# Patient Record
Sex: Male | Born: 1980 | Race: White | Hispanic: No | Marital: Single | Smoking: Never smoker
Health system: Southern US, Community
[De-identification: ages and names within clinical notes are randomized; demographics above are authoritative.]

## PROBLEM LIST (undated history)

## (undated) DIAGNOSIS — G4730 Sleep apnea, unspecified: Secondary | ICD-10-CM

## (undated) HISTORY — PX: BRAIN SURGERY: SHX531

## (undated) HISTORY — DX: Sleep apnea, unspecified: G47.30

---

## 2006-10-06 ENCOUNTER — Emergency Department (HOSPITAL_COMMUNITY): Admission: EM | Admit: 2006-10-06 | Discharge: 2006-10-07 | Payer: Self-pay | Admitting: Emergency Medicine
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
## 2010-01-30 ENCOUNTER — Encounter: Payer: Self-pay | Admitting: *Deleted

## 2010-10-20 LAB — I-STAT 8, (EC8 V) (CONVERTED LAB)
Acid-base deficit: 2
Bicarbonate: 25.1 — ABNORMAL HIGH
Chloride: 105
HCT: 38 — ABNORMAL LOW
Operator id: 272551
Sodium: 140
pCO2, Ven: 49.8

## 2010-10-20 LAB — RAPID URINE DRUG SCREEN, HOSP PERFORMED
Amphetamines: NOT DETECTED
Barbiturates: NOT DETECTED
Cocaine: NOT DETECTED
Opiates: NOT DETECTED
Tetrahydrocannabinol: NOT DETECTED

## 2010-10-20 LAB — DIFFERENTIAL
Basophils Absolute: 0
Basophils Relative: 0
Lymphocytes Relative: 12
Monocytes Relative: 5
Neutro Abs: 10.2 — ABNORMAL HIGH
Neutrophils Relative %: 81 — ABNORMAL HIGH

## 2010-10-20 LAB — CBC
Hemoglobin: 12.4 — ABNORMAL LOW
MCHC: 33.4
MCV: 88.6
RBC: 4.19 — ABNORMAL LOW
WBC: 12.5 — ABNORMAL HIGH

## 2010-10-20 LAB — POCT I-STAT CREATININE: Creatinine, Ser: 1.1

## 2010-10-20 LAB — ETHANOL: Alcohol, Ethyl (B): <5

## 2019-10-17 ENCOUNTER — Other Ambulatory Visit: Payer: Self-pay

## 2019-10-17 ENCOUNTER — Emergency Department (HOSPITAL_COMMUNITY)
Admission: EM | Admit: 2019-10-17 | Discharge: 2019-10-17 | Disposition: A | Discharge status: Home / Self Care | Payer: PRIVATE HEALTH INSURANCE | Attending: Emergency Medicine | Admitting: Emergency Medicine

## 2019-10-17 ENCOUNTER — Emergency Department (HOSPITAL_COMMUNITY): Payer: PRIVATE HEALTH INSURANCE

## 2019-10-17 ENCOUNTER — Encounter (HOSPITAL_COMMUNITY): Payer: Self-pay | Admitting: Emergency Medicine

## 2019-10-17 DIAGNOSIS — S51012A Laceration without foreign body of left elbow, initial encounter: Secondary | ICD-10-CM | POA: Diagnosis not present

## 2019-10-17 DIAGNOSIS — Z23 Encounter for immunization: Secondary | ICD-10-CM | POA: Diagnosis not present

## 2019-10-17 MED ORDER — CEPHALEXIN 250 MG PO CAPS
500.0000 mg | ORAL_CAPSULE | Freq: Once | ORAL | Status: AC
Start: 2019-10-17 — End: 2019-10-17
  Administered 2019-10-17: 500 mg via ORAL
  Filled 2019-10-17: qty 2

## 2019-10-17 MED ORDER — LIDOCAINE-EPINEPHRINE 1 %-1:100000 IJ SOLN
10.0000 mL | Freq: Once | INTRAMUSCULAR | Status: AC
  Administered 2019-10-17: 10 mL
  Filled 2019-10-17: qty 1

## 2019-10-17 MED ORDER — TETANUS-DIPHTH-ACELL PERTUSSIS 5-2.5-18.5 LF-MCG/0.5 IM SUSP
0.5000 mL | Freq: Once | INTRAMUSCULAR | Status: AC
  Administered 2019-10-17: 0.5 mL via INTRAMUSCULAR
  Filled 2019-10-17: qty 0.5

## 2019-10-17 MED ORDER — ACETAMINOPHEN 500 MG PO TABS
1000.0000 mg | ORAL_TABLET | Freq: Once | ORAL | Status: AC
  Administered 2019-10-17: 1000 mg via ORAL
  Filled 2019-10-17: qty 2

## 2019-10-17 NOTE — ED Notes (Signed)
Patient verbalizes understanding of discharge instructions. Opportunity for questioning and answers were provided. Armband removed by staff, pt discharged from ED ambulatory.   
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
## 2019-10-17 NOTE — ED Notes (Signed)
Pt in xray

## 2019-10-17 NOTE — Discharge Instructions (Addendum)
Follow up in 7-10 days for removal. Please return if increasing redness, warmth, or pustulance.

## 2019-10-27 ENCOUNTER — Emergency Department (HOSPITAL_COMMUNITY)
Admission: EM | Admit: 2019-10-27 | Discharge: 2019-10-27 | Disposition: A | Discharge status: Home / Self Care | Payer: PRIVATE HEALTH INSURANCE | Attending: Emergency Medicine | Admitting: Emergency Medicine

## 2019-10-27 ENCOUNTER — Encounter (HOSPITAL_COMMUNITY): Payer: Self-pay

## 2019-10-27 DIAGNOSIS — X58XXXA Exposure to other specified factors, initial encounter: Secondary | ICD-10-CM | POA: Diagnosis not present

## 2019-10-27 DIAGNOSIS — S51012D Laceration without foreign body of left elbow, subsequent encounter: Secondary | ICD-10-CM | POA: Insufficient documentation

## 2019-10-27 DIAGNOSIS — Z4802 Encounter for removal of sutures: Secondary | ICD-10-CM

## 2019-10-27 NOTE — ED Triage Notes (Signed)
Pt here to get stiches removed from left elbow, placed 11 days ago
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
## 2019-10-27 NOTE — ED Notes (Signed)
ED Provider at bedside. 

## 2020-08-05 ENCOUNTER — Ambulatory Visit (INDEPENDENT_AMBULATORY_CARE_PROVIDER_SITE_OTHER): Payer: Self-pay | Admitting: Pulmonary Disease

## 2020-08-05 ENCOUNTER — Other Ambulatory Visit: Payer: Self-pay

## 2020-08-05 ENCOUNTER — Encounter: Payer: Self-pay | Admitting: Pulmonary Disease

## 2020-08-05 VITALS — BP 130/80 | HR 112 | Ht 72.0 in | Wt 369.2 lb

## 2020-08-05 DIAGNOSIS — G4719 Other hypersomnia: Secondary | ICD-10-CM

## 2020-08-05 NOTE — Progress Notes (Signed)
Brian Wu    [id number]    Sep 22, 1980  Primary Care Physician:Pcp, No  Referring Physician: No referring provider defined for this encounter.  Chief complaint:  Patient being seen for possible obstructive sleep apnea  HPI:  History of snoring, witnessed apneas Nonrestorative sleep Drowsiness  Usually goes to bed about 1130 Wake up time about 630 5 to 15 minutes of sleep onset Weight is recently down -Working on weight loss  Dad has obstructive sleep apnea on CPAP  Admits to night sweats Occasional headaches Admits to dryness of his mouth in the morning  He does have hypertension that is controlled Non-smoker No pertinent occupational history  Outpatient Encounter Medications as of 08/05/2020  Medication Sig   Multiple Vitamin (MULTIVITAMIN) tablet Take 1 tablet by mouth daily.   No facility-administered encounter medications on file as of 08/05/2020.    Allergies as of 08/05/2020   (No Known Allergies)    No past medical history on file.  No past surgical history on file.  Family History  Problem Relation Age of Onset   Diabetes Mother    Diabetes Maternal Grandmother    Lymphoma Maternal Grandmother    Diabetes Maternal Grandfather    Diabetes Paternal Grandmother    Diabetes Paternal Grandfather     Social History   Socioeconomic History   Marital status: Single    Spouse name: Not on file   Number of children: Not on file   Years of education: Not on file   Highest education level: Not on file  Occupational History   Not on file  Tobacco Use   Smoking status: Never   Smokeless tobacco: Never  Substance and Sexual Activity   Alcohol use: Not Currently   Drug use: Not Currently   Sexual activity: Not on file  Other Topics Concern   Not on file  Social History Narrative   Not on file   Social Determinants of Health   Financial Resource Strain: Not on file  Food Insecurity: Not on file  Transportation Needs: Not on  file  Physical Activity: Not on file  Stress: Not on file  Social Connections: Not on file  Intimate Partner Violence: Not on file    Review of Systems  Constitutional:  Positive for fatigue.  Respiratory:  Positive for apnea.   Psychiatric/Behavioral:  Positive for sleep disturbance.    Vitals:   08/05/20 0929  BP: 130/80  Pulse: (!) 112  SpO2: 97%     Physical Exam Constitutional:      Appearance: He is obese.  HENT:     Head: Normocephalic.     Nose: No congestion.     Mouth/Throat:     Mouth: Mucous membranes are moist.     Comments: Mallampati 4, crowded oropharynx Eyes:     Pupils: Pupils are equal, round, and reactive to light.  Neck:     Comments: Neck is thick, supple Cardiovascular:     Rate and Rhythm: Normal rate and regular rhythm.     Heart sounds: No murmur heard.   No friction rub.  Pulmonary:     Effort: Pulmonary effort is normal. No respiratory distress.     Breath sounds: No stridor. No wheezing or rhonchi.  Musculoskeletal:     Cervical back: No rigidity or tenderness.  Neurological:     Mental Status: He is alert.  Psychiatric:        Mood and Affect: Mood normal.   Results  of the Epworth flowsheet 08/05/2020  Sitting and reading 3  Watching TV 3  Sitting, inactive in a public place (e.g. a theatre or a meeting) 3  As a passenger in a car for an hour without a break 1  Lying down to rest in the afternoon when circumstances permit 3  Sitting and talking to someone 0  Sitting quietly after a lunch without alcohol 0  In a car, while stopped for a few minutes in traffic 0  Total score 13    Assessment:  Moderate probability of significant obstructive sleep apnea  Morbid obesity  Hypertension  Pathophysiology of sleep disordered breathing discussed with the patient Treatment options discussed with the patient  Plan/Recommendations: Schedule patient for home sleep study  Weight loss efforts encouraged  Encourage sleep in the  lateral position, sleeping in a recliner may help-which he does once in a while  I will tentatively follow-up with him in about 3 to 4 months  Encouraged to call with any significant concerns   Virl Diamond MD [facility] 08/05/2020, 9:57 AM  CC: No ref. provider found

## 2020-08-05 NOTE — Patient Instructions (Signed)
Moderate probability of significant obstructive sleep apnea  We will schedule you for home sleep study Update your results as soon as reviewed  We will see you in 3 to 4 months  Sleep Apnea Sleep apnea is a condition in which breathing pauses or becomes shallow during sleep. People with sleep apnea usually snore loudly. They may have times when they gasp and stop breathing for 10 seconds or more during sleep. This mayhappen many times during the night. Sleep apnea disrupts your sleep and keeps your body from getting the rest that it needs. This condition can increase your risk of certain health problems, including: Heart attack. Stroke. Obesity. Type 2 diabetes. Heart failure. Irregular heartbeat. High blood pressure. The goal of treatment is to help you breathe normally again. What are the causes? The most common cause of sleep apnea is a collapsed or blocked airway. There are three kinds of sleep apnea: Obstructive sleep apnea. This kind is caused by a blocked or collapsed airway. Central sleep apnea. This kind happens when the part of the brain that controls breathing does not send the correct signals to the muscles that control breathing. Mixed sleep apnea. This is a combination of obstructive and central sleep apnea. What increases the risk? You are more likely to develop this condition if you: Are overweight. Smoke. Have a smaller than normal airway. Are older. Are male. Drink alcohol. Take sedatives or tranquilizers. Have a family history of sleep apnea. Have a tongue or tonsils that are larger than normal. What are the signs or symptoms? Symptoms of this condition include: Trouble staying asleep. Loud snoring. Morning headaches. Waking up gasping. Dry mouth or sore throat in the morning. Daytime sleepiness and tiredness. If you have daytime fatigue because of sleep apnea, you may be more likely to have: Trouble concentrating. Forgetfulness. Irritability or mood  swings. Personality changes. Feelings of depression. Sexual dysfunction. This may include loss of interest if you are male, or erectile dysfunction if you are male. How is this diagnosed? This condition may be diagnosed with: A medical history. A physical exam. A series of tests that are done while you are sleeping (sleep study). These tests are usually done in a sleep lab, but they may also be done at home. How is this treated? Treatment for this condition aims to restore normal breathing and to ease symptoms during sleep. It may involve managing health issues that can affect breathing, such as high blood pressure or obesity. Treatment may include: Sleeping on your side. Using a decongestant if you have nasal congestion. Avoiding the use of depressants, including alcohol, sedatives, and narcotics. Losing weight if you are overweight. Making changes to your diet. Quitting smoking. Using a device to open your airway while you sleep, such as: An oral appliance. This is a custom-made mouthpiece that shifts your lower jaw forward. A continuous positive airway pressure (CPAP) device. This device blows air through a mask when you breathe out (exhale). A nasal expiratory positive airway pressure (EPAP) device. This device has valves that you put into each nostril. A bi-level positive airway pressure (BPAP) device. This device blows air through a mask when you breathe in (inhale) and breathe out (exhale). Having surgery if other treatments do not work. During surgery, excess tissue is removed to create a wider airway. Follow these instructions at home: Lifestyle Make any lifestyle changes that your health care provider recommends. Eat a healthy, well-balanced diet. Take steps to lose weight if you are overweight. Avoid using depressants, including  alcohol, sedatives, and narcotics. Do not use any products that contain nicotine or tobacco. These products include cigarettes, chewing tobacco, and  vaping devices, such as e-cigarettes. If you need help quitting, ask your health care provider. General instructions Take over-the-counter and prescription medicines only as told by your health care provider. If you were given a device to open your airway while you sleep, use it only as told by your health care provider. If you are having surgery, make sure to tell your health care provider you have sleep apnea. You may need to bring your device with you. Keep all follow-up visits. This is important. Contact a health care provider if: The device that you received to open your airway during sleep is uncomfortable or does not seem to be working. Your symptoms do not improve. Your symptoms get worse. Get help right away if: You develop: Chest pain. Shortness of breath. Discomfort in your back, arms, or stomach. You have: Trouble speaking. Weakness on one side of your body. Drooping in your face. These symptoms may represent a serious problem that is an emergency. Do not wait to see if the symptoms will go away. Get medical help right away. Call your local emergency services (911 in the [place] Do not drive yourself to the hospital. Summary Sleep apnea is a condition in which breathing pauses or becomes shallow during sleep. The most common cause is a collapsed or blocked airway. The goal of treatment is to restore normal breathing and to ease symptoms during sleep. This information is not intended to replace advice given to you by your health care provider. Make sure you discuss any questions you have with your healthcare provider. Document Revised: 12/05/2019 Document Reviewed: 12/05/2019 Elsevier Patient Education  2022 [employer].

## 2020-08-05 NOTE — Addendum Note (Signed)
Addended by: Arvilla Market on: 08/05/2020 10:09 AM   Modules accepted: Orders

## 2020-09-10 ENCOUNTER — Other Ambulatory Visit: Payer: Self-pay

## 2020-09-10 ENCOUNTER — Ambulatory Visit: Payer: Self-pay

## 2020-09-10 DIAGNOSIS — G4719 Other hypersomnia: Secondary | ICD-10-CM

## 2020-09-10 DIAGNOSIS — G4733 Obstructive sleep apnea (adult) (pediatric): Secondary | ICD-10-CM

## 2020-09-15 ENCOUNTER — Telehealth: Payer: Self-pay | Admitting: Pulmonary Disease

## 2020-09-15 DIAGNOSIS — G4733 Obstructive sleep apnea (adult) (pediatric): Secondary | ICD-10-CM

## 2020-09-15 NOTE — Telephone Encounter (Signed)
Call patient  Sleep study result  Date of study: 09/11/2020  Impression: Severe obstructive sleep apnea Severe oxygen desaturations  Recommendation: Recommend CPAP therapy for severe obstructive sleep apnea Patient to be scheduled for an in lab titration study for severe obstructive sleep apnea and associated severe desaturations -May need oxygen supplementation  Encourage weight loss measures  Follow-up as previously scheduled
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
## 2020-09-24 NOTE — Telephone Encounter (Signed)
ATC, left VM. 

## 2020-09-27 NOTE — Telephone Encounter (Signed)
Spoke with patient regarding sleep study results. They verbalized understanding. No further questions. Order for CPAP titration has been ordered. Nothing further needed at this time.

## 2020-11-19 ENCOUNTER — Ambulatory Visit (HOSPITAL_BASED_OUTPATIENT_CLINIC_OR_DEPARTMENT_OTHER): Payer: Self-pay | Attending: Pulmonary Disease | Admitting: Pulmonary Disease

## 2020-11-19 ENCOUNTER — Other Ambulatory Visit: Payer: Self-pay

## 2020-11-19 DIAGNOSIS — G4733 Obstructive sleep apnea (adult) (pediatric): Secondary | ICD-10-CM

## 2020-11-29 ENCOUNTER — Telehealth: Payer: Self-pay | Admitting: Pulmonary Disease

## 2020-11-29 DIAGNOSIS — G4733 Obstructive sleep apnea (adult) (pediatric): Secondary | ICD-10-CM

## 2020-11-29 DIAGNOSIS — G4719 Other hypersomnia: Secondary | ICD-10-CM

## 2020-11-29 NOTE — Telephone Encounter (Signed)
Call patient  Sleep study result  Date of study: 11/19/2020  Impression: Severe obstructive sleep apnea Severe oxygen desaturations  Recommendation: DME referral  Recommend Bipap  therapy for moderate obstructive sleep apnea  Trial of BiPAP therapy on 21/17 cm H2O , bi-flex of 2, with a Large size Fisher&Paykel Nasal Mask Eson mask and heated humidification.  Encourage weight loss measures  Follow-up in the office 4 to 6 weeks following initiation of treatment

## 2020-11-29 NOTE — Procedures (Signed)
POLYSOMNOGRAPHY  Last, First: Brian Wu, Brian Wu MRN: [id number] Gender: Male Age (years): 40 Weight (lbs): 380 DOB: 02/24/1980 BMI: 53 Primary Care: No PCP Epworth Score: 7 Referring: Tomma Lightning MD Technician: Cherylann Parr Interpreting: Tomma Lightning MD Study Type: BiPAP Ordered Study Type: CPAP Study date: 11/19/2020 Location: [place] CLINICAL INFORMATION Brian Wu is a 40 year old Male and was referred to the sleep center for evaluation of N/A. Indications include N/A.  MEDICATIONS Patient self administered medications include: N/A. Medications administered during study include No sleep medicine administered.  SLEEP STUDY TECHNIQUE The patient underwent an attended overnight polysomnography titration to assess the effects of BIPAP therapy. The following variables were monitored: EEG(C4-A1, C3-A2, O1-A2, O2-A1), EOG, submental and leg EMG, ECG, oxyhemoglobin saturation by pulse oximetry, thoracic and abdominal respiratory effort belts, nasal/oral airflow by pressure sensor, body position sensor and snoring sensor. BIPAP pressure was titrated to eliminate apneas, hypopneas and oxygen desaturation. Hypopneas were scored per AASM definition IB (4% desaturation)  TECHNICIAN COMMENTS Comments added by Technician: No oxygen therapy was started. Comments added by Scorer: N/A SLEEP ARCHITECTURE The study was initiated at 11:16:31 PM and terminated at 5:15:47 AM. Total recorded time was 359.3 minutes. EEG confirmed total sleep time was 332.3 minutes yielding a sleep efficiency of 92.5%%. Sleep onset after lights out was 4.0 minutes with a REM latency of 183.5 minutes. The patient spent 1.7%% of the night in stage N1 sleep, 78.8%% in stage N2 sleep, 0.0%% in stage N3 and 19.6% in REM. The Arousal Index was 25.1/hour. RESPIRATORY PARAMETERS The overall AHI was 51.3 per hour, and the RDI was 51.8 events/hour with a central apnea index of 1.6per hour. The most appropriate setting  of BiPAP was 21/17 cm H2O. At this setting, the sleep efficiency was 100 % and the patient was supine for 43%. The AHI was 0 events per hour, and the RDI was 0 events/hour (with 1.6 central events) and the arousal index was 4.5 per hour.The oxygen nadir was 88.0% during sleep.    The cumulative time under 88% oxygen saturation was 5.5 minutes  LEG MOVEMENT DATA The total leg movements were 0 with a resulting leg movement index of 0.0. Associated arousal with leg movement index was 0.0. CARDIAC DATA The underlying cardiac rhythm was most consistent with sinus rhythm. Mean heart rate during sleep was 78.7 bpm. Additional rhythm abnormalities include Ventricular Tachycardia, PVCs.  IMPRESSIONS - Severe Obstructive Sleep apnea(OSA) Optimal pressure attained. - Electrocardiographic data showed presence of Ventricular Tachycardia, PVCs. - No snoring was audible during this study. - No significant periodic leg movements(PLMs) during sleep. However, no significant associated arousals.  DIAGNOSIS - Obstructive Sleep Apnea (G47.33)  RECOMMENDATIONS - Trial of BiPAP therapy on 21/17  cm H2O , bi-flex of 2, with a Large size Fisher&Paykel Nasal Mask Eson mask and heated humidification. - Avoid alcohol, sedatives and other CNS depressants that may worsen sleep apnea and disrupt normal sleep architecture. - Sleep hygiene should be reviewed to assess factors that may improve sleep quality. - Weight management and regular exercise should be initiated or continued. - Return to Sleep Center for re-evaluation after 4 weeks of therapy  [Electronically signed] 11/29/2020 02:55 PM  Virl Diamond MD NPI: [id number]

## 2020-11-29 NOTE — Telephone Encounter (Signed)
I called the patient and gave him the results and he is agreeable to start the CPAP.   I let patient know I am placing a Bipap order. The patient is aware that it may take some time to get the machine and he will call back to set up a 4-6 week follow up after getting the machine.

## 2020-12-03 ENCOUNTER — Ambulatory Visit: Payer: Self-pay | Admitting: Pulmonary Disease

## 2020-12-20 ENCOUNTER — Telehealth: Payer: Self-pay | Admitting: Pulmonary Disease

## 2020-12-20 NOTE — Telephone Encounter (Signed)
I was trying to call and see if the patient was set up for a BiPAP as of now. He has a follow up and if he is coming we need an SD card for his information. I was not able to leave a message.

## 2020-12-21 ENCOUNTER — Encounter: Payer: Self-pay | Admitting: Pulmonary Disease

## 2020-12-21 ENCOUNTER — Ambulatory Visit (INDEPENDENT_AMBULATORY_CARE_PROVIDER_SITE_OTHER): Payer: Self-pay | Admitting: Pulmonary Disease

## 2020-12-21 ENCOUNTER — Other Ambulatory Visit: Payer: Self-pay

## 2020-12-21 VITALS — BP 126/82 | HR 80 | Temp 98.0°F | Ht 73.0 in | Wt 380.0 lb

## 2020-12-21 DIAGNOSIS — G4733 Obstructive sleep apnea (adult) (pediatric): Secondary | ICD-10-CM

## 2020-12-21 NOTE — Patient Instructions (Signed)
Follow-up in about 3 months  Let us know soon after you get your machine if you are not tolerating the pressures well  Encouraged graded exercises to help with weight loss    Call us with significant concerns

## 2020-12-21 NOTE — Progress Notes (Signed)
Brian Wu    [id number]    July 06, 1980  Primary Care Physician:Pcp, No  Referring Physician: No referring provider defined for this encounter.  Chief complaint:  Patient being seen for follow-up for obstructive sleep apnea  HPI:  he has been titrated to a pressure of 21/17 Still yet to receive his machine  Slept well during the titration study and felt better the next day  No significant changes in his health since the last visit  History of snoring, witnessed apneas Nonrestorative sleep Drowsiness  Usually goes to bed about 1130 Wake up time about 630 5 to 15 minutes of sleep onset Weight is recently down -Working on weight loss  Dad has obstructive sleep apnea on CPAP  Admits to night sweats Occasional headaches Admits to dryness of his mouth in the morning  He does have hypertension that is controlled Non-smoker No pertinent occupational history  Outpatient Encounter Medications as of 12/21/2020  Medication Sig   Multiple Vitamin (MULTIVITAMIN) tablet Take 1 tablet by mouth daily.   No facility-administered encounter medications on file as of 12/21/2020.    Allergies as of 12/21/2020   (No Known Allergies)    No past medical history on file.  No past surgical history on file.  Family History  Problem Relation Age of Onset   Diabetes Mother    Diabetes Maternal Grandmother    Lymphoma Maternal Grandmother    Diabetes Maternal Grandfather    Diabetes Paternal Grandmother    Diabetes Paternal Grandfather     Social History   Socioeconomic History   Marital status: Single    Spouse name: Not on file   Number of children: Not on file   Years of education: Not on file   Highest education level: Not on file  Occupational History   Not on file  Tobacco Use   Smoking status: Never   Smokeless tobacco: Never  Substance and Sexual Activity   Alcohol use: Not Currently   Drug use: Not Currently   Sexual activity: Not on file   Other Topics Concern   Not on file  Social History Narrative   Not on file   Social Determinants of Health   Financial Resource Strain: Not on file  Food Insecurity: Not on file  Transportation Needs: Not on file  Physical Activity: Not on file  Stress: Not on file  Social Connections: Not on file  Intimate Partner Violence: Not on file    Review of Systems  Constitutional:  Positive for fatigue.  Respiratory:  Positive for apnea.   Psychiatric/Behavioral:  Positive for sleep disturbance.    Vitals:   12/21/20 1507  BP: 126/82  Pulse: 80  Temp: 98 F (36.7 C)  SpO2: 98%     Physical Exam Constitutional:      Appearance: He is obese.  HENT:     Head: Normocephalic.     Nose: No congestion.     Mouth/Throat:     Mouth: Mucous membranes are moist.     Comments: Mallampati 4, crowded oropharynx Eyes:     Pupils: Pupils are equal, round, and reactive to light.  Neck:     Comments: Neck is thick, supple Cardiovascular:     Rate and Rhythm: Normal rate and regular rhythm.     Heart sounds: No murmur heard.   No friction rub.  Pulmonary:     Effort: Pulmonary effort is normal. No respiratory distress.     Breath sounds:  No stridor. No wheezing or rhonchi.  Musculoskeletal:     Cervical back: No rigidity or tenderness.  Neurological:     Mental Status: He is alert.  Psychiatric:        Mood and Affect: Mood normal.   Results of the Epworth flowsheet 08/05/2020  Sitting and reading 3  Watching TV 3  Sitting, inactive in a public place (e.g. a theatre or a meeting) 3  As a passenger in a car for an hour without a break 1  Lying down to rest in the afternoon when circumstances permit 3  Sitting and talking to someone 0  Sitting quietly after a lunch without alcohol 0  In a car, while stopped for a few minutes in traffic 0  Total score 13    Assessment:   Patient with severe obstructive sleep apnea Diagnosed from a home sleep study Titrated to BiPAP  21/17  -Yet to receive his machine  Feels relatively well otherwise  Morbid obesity  Hypertension   Plan/Recommendations:  DME referral for set up for his BiPAP -He is scheduled to take classes for have to take care of her machine -He will have his machine soon after that  We will see him back in about 3 months  Encouraged to call with any significant concerns if he is not tolerating pressures well   Virl Diamond MD [facility] 12/21/2020, 3:13 PM  CC: No ref. provider found

## 2021-03-21 ENCOUNTER — Other Ambulatory Visit: Payer: Self-pay

## 2021-03-21 ENCOUNTER — Ambulatory Visit (INDEPENDENT_AMBULATORY_CARE_PROVIDER_SITE_OTHER): Payer: Self-pay | Admitting: Pulmonary Disease

## 2021-03-21 ENCOUNTER — Encounter: Payer: Self-pay | Admitting: Pulmonary Disease

## 2021-03-21 VITALS — BP 126/80 | HR 87 | Temp 98.7°F | Ht 72.0 in | Wt 356.0 lb

## 2021-03-21 DIAGNOSIS — G4733 Obstructive sleep apnea (adult) (pediatric): Secondary | ICD-10-CM

## 2021-03-21 DIAGNOSIS — Z9989 Dependence on other enabling machines and devices: Secondary | ICD-10-CM

## 2021-03-21 NOTE — Patient Instructions (Signed)
Continue using a BiPAP on a nightly basis ? ?We will contact the DME company to decrease the pressure to 20/16 ? ?Call us with significant concerns ? ?Follow-up in 3 months ? ?Continue weight loss efforts ?

## 2021-03-21 NOTE — Progress Notes (Signed)
? ?      ?Brian Wu    [id number]    17-Mar-1980 ? ?Primary Care Physician:Pcp, No ? ?Referring Physician: No referring provider defined for this encounter. ? ?Chief complaint:  ?Patient being seen for follow-up for obstructive sleep apnea  ? ?HPI: ? ?he has been titrated to a pressure of 21/17 ?Has been using CPAP on a nightly basis ? ?Sometimes wakes up with the mask off his face ? ?Overall feeling a lot better ?Better energy levels ? ?Some difficulty falling asleep which may be related to caffeinated beverages ? ?Tolerating pressures well but occasionally feels the pressure may be high ? ?Better since her last visit ? ?Diagnosed with severe obstructive sleep apnea ? ?Usually goes to bed about 1130 ?Wake up time about 630 ?Sleep onset is longer with using BiPAP ?Weight is recently down ?-Working on weight loss ? ?Dad has obstructive sleep apnea on CPAP ? ?Admits to night sweats ?Occasional headaches ?Admits to dryness of his mouth in the morning ? ?He does have hypertension that is controlled ?Non-smoker ?No pertinent occupational history ? ?Outpatient Encounter Medications as of 03/21/2021  ?Medication Sig  ? Multiple Vitamin (MULTIVITAMIN) tablet Take 1 tablet by mouth daily.  ? ?No facility-administered encounter medications on file as of 03/21/2021.  ? ? ?Allergies as of 03/21/2021  ? (No Known Allergies)  ? ? ?No past medical history on file. ? ?No past surgical history on file. ? ?Family History  ?Problem Relation Age of Onset  ? Diabetes Mother   ? Diabetes Maternal Grandmother   ? Lymphoma Maternal Grandmother   ? Diabetes Maternal Grandfather   ? Diabetes Paternal Grandmother   ? Diabetes Paternal Grandfather   ? ? ?Social History  ? ?Socioeconomic History  ? Marital status: Single  ?  Spouse name: Not on file  ? Number of children: Not on file  ? Years of education: Not on file  ? Highest education level: Not on file  ?Occupational History  ? Not on file  ?Tobacco Use  ? Smoking status: Never  ?  Smokeless tobacco: Never  ?Substance and Sexual Activity  ? Alcohol use: Not Currently  ? Drug use: Not Currently  ? Sexual activity: Not on file  ?Other Topics Concern  ? Not on file  ?Social History Narrative  ? Not on file  ? ?Social Determinants of Health  ? ?Financial Resource Strain: Not on file  ?Food Insecurity: Not on file  ?Transportation Needs: Not on file  ?Physical Activity: Not on file  ?Stress: Not on file  ?Social Connections: Not on file  ?Intimate Partner Violence: Not on file  ? ? ?Review of Systems  ?Constitutional:  Positive for fatigue.  ?Respiratory:  Positive for apnea.   ?Psychiatric/Behavioral:  Positive for sleep disturbance.   ? ?Vitals:  ? 03/21/21 1444  ?BP: 126/80  ?Pulse: 87  ?Temp: 98.7 ?F (37.1 ?C)  ?SpO2: 95%  ? ? ? ?Physical Exam ?Constitutional:   ?   Appearance: He is obese.  ?HENT:  ?   Head: Normocephalic.  ?   Nose: No congestion.  ?   Mouth/Throat:  ?   Mouth: Mucous membranes are moist.  ?   Comments: Mallampati 4, crowded oropharynx ?Eyes:  ?   Pupils: Pupils are equal, round, and reactive to light.  ?Neck:  ?   Comments: Neck is thick, supple ?Cardiovascular:  ?   Rate and Rhythm: Normal rate and regular rhythm.  ?   Heart sounds: No murmur  heard. ?  No friction rub.  ?Pulmonary:  ?   Effort: Pulmonary effort is normal. No respiratory distress.  ?   Breath sounds: No stridor. No wheezing or rhonchi.  ?Musculoskeletal:  ?   Cervical back: No rigidity or tenderness.  ?Neurological:  ?   Mental Status: He is alert.  ?Psychiatric:     ?   Mood and Affect: Mood normal.  ? ?Results of the Epworth flowsheet 08/05/2020  ?Sitting and reading 3  ?Watching TV 3  ?Sitting, inactive in a public place (e.g. a theatre or a meeting) 3  ?As a passenger in a car for an hour without a break 1  ?Lying down to rest in the afternoon when circumstances permit 3  ?Sitting and talking to someone 0  ?Sitting quietly after a lunch without alcohol 0  ?In a car, while stopped for a few minutes in  traffic 0  ?Total score 13  ? ?Compliance data shows 74% compliance ?Average use of about 4 hours ?Residual AHI of 3.7 ? ?Assessment:  ? ?Patient with severe obstructive sleep apnea ?Diagnosed from a home sleep study ?Titrated to BiPAP 21/17 ?Has been using BiPAP nightly ?-Pressure may be high ?-Sometimes wakes up with the mask off his face ? ?Feels  better with BiPAP use ? ?Morbid obesity ?-More active ?-Working on weight loss ? ?Hypertension ? ? ?Plan/Recommendations: ? ?DME referral for set up for his BiPAP ?-Adjust pressure down from 21 /17-20/16 ?-Encouraged to continue BiPAP on a nightly basis ? ?Discussed weight loss efforts ?-Decreased calorie intake ?-Exercise regularly ? ?I will see him back in about 3 months ? ?With concerns about taking the mask off and the sensation of high pressures ?-It is appropriate to decrease his pressure ? ?Encouraged to call with any significant concerns if he is not tolerating pressures well ? ? ?Sherrilyn Rist MD ?[facility] ?03/21/2021, 2:51 PM ? ?CC: No ref. provider found ? ? ? ?

## 2021-03-21 NOTE — Addendum Note (Signed)
Addended by: Arvilla Market on: 03/21/2021 03:30 PM ? ? Modules accepted: Orders ? ?

## 2021-06-21 ENCOUNTER — Encounter: Payer: Self-pay | Admitting: Pulmonary Disease

## 2021-06-21 ENCOUNTER — Ambulatory Visit (INDEPENDENT_AMBULATORY_CARE_PROVIDER_SITE_OTHER): Payer: Self-pay | Admitting: Pulmonary Disease

## 2021-06-21 VITALS — BP 124/82 | HR 99 | Temp 98.1°F | Ht 72.0 in | Wt 365.0 lb

## 2021-06-21 DIAGNOSIS — Z9989 Dependence on other enabling machines and devices: Secondary | ICD-10-CM

## 2021-06-21 DIAGNOSIS — G4733 Obstructive sleep apnea (adult) (pediatric): Secondary | ICD-10-CM

## 2021-06-21 NOTE — Progress Notes (Signed)
Brian Wu    [id number]    1980/12/05  Primary Care Physician:Pcp, No  Referring Physician: No referring provider defined for this encounter.  Chief complaint:  Patient being seen for follow-up for obstructive sleep apnea In for follow-up today, has been doing well  HPI:  he has been titrated to a pressure of 21/17  Continues to use BiPAP on a nightly basis Feels he continues to benefit from it No significant daytime sleepiness  Usually goes to bed about midnight, wake up time varies quite a bit but does get enough sleep Feels well rested No significant daytime sleepiness  Weight has been stable No recent health issues  Sometimes wakes up with the mask off his face  Good energy levels  Diagnosed with severe obstructive sleep apnea  Dad has obstructive sleep apnea on CPAP  Admits to night sweats Occasional headaches Admits to dryness of his mouth in the morning  He does have hypertension that is controlled Non-smoker No pertinent occupational history  Outpatient Encounter Medications as of 06/21/2021  Medication Sig   Multiple Vitamin (MULTIVITAMIN) tablet Take 1 tablet by mouth daily.   No facility-administered encounter medications on file as of 06/21/2021.    Allergies as of 06/21/2021   (No Known Allergies)    No past medical history on file.  No past surgical history on file.  Family History  Problem Relation Age of Onset   Diabetes Mother    Diabetes Maternal Grandmother    Lymphoma Maternal Grandmother    Diabetes Maternal Grandfather    Diabetes Paternal Grandmother    Diabetes Paternal Grandfather     Social History   Socioeconomic History   Marital status: Single    Spouse name: Not on file   Number of children: Not on file   Years of education: Not on file   Highest education level: Not on file  Occupational History   Not on file  Tobacco Use   Smoking status: Never   Smokeless tobacco: Never  Substance and  Sexual Activity   Alcohol use: Not Currently   Drug use: Not Currently   Sexual activity: Not on file  Other Topics Concern   Not on file  Social History Narrative   Not on file   Social Determinants of Health   Financial Resource Strain: Not on file  Food Insecurity: Not on file  Transportation Needs: Not on file  Physical Activity: Not on file  Stress: Not on file  Social Connections: Not on file  Intimate Partner Violence: Not on file    Review of Systems  Constitutional:  Positive for fatigue.  Respiratory:  Positive for apnea.   Psychiatric/Behavioral:  Positive for sleep disturbance.     Vitals:   06/21/21 1337  BP: 124/82  Pulse: 99  Temp: 98.1 F (36.7 C)  SpO2: 98%     Physical Exam Constitutional:      Appearance: He is obese.  HENT:     Head: Normocephalic.     Nose: No congestion.     Mouth/Throat:     Mouth: Mucous membranes are moist.     Comments: Mallampati 4, crowded oropharynx Eyes:     Pupils: Pupils are equal, round, and reactive to light.  Neck:     Comments: Neck is thick, supple Cardiovascular:     Rate and Rhythm: Normal rate and regular rhythm.     Heart sounds: No murmur heard.    No friction rub.  Pulmonary:     Effort: Pulmonary effort is normal. No respiratory distress.     Breath sounds: No stridor. No wheezing or rhonchi.  Musculoskeletal:     Cervical back: No rigidity or tenderness.  Neurological:     Mental Status: He is alert.  Psychiatric:        Mood and Affect: Mood normal.       08/05/2020    9:00 AM  Results of the Epworth flowsheet  Sitting and reading 3  Watching TV 3  Sitting, inactive in a public place (e.g. a theatre or a meeting) 3  As a passenger in a car for an hour without a break 1  Lying down to rest in the afternoon when circumstances permit 3  Sitting and talking to someone 0  Sitting quietly after a lunch without alcohol 0  In a car, while stopped for a few minutes in traffic 0  Total  score 13   Compliance download result reviewed showing 100% compliance with CPAP use Machine set 21/17 Average AHI of 2.4  Assessment:   Severe obstructive sleep apnea adequately treated with BiPAP  Continues to benefit from BiPAP use  Class III obesity -Encouraged to continue weight loss efforts  Hypertension  Plan/Recommendations:  Continue BiPAP nightly  Follow-up in 6 months  Call with significant concerns     Sherrilyn Rist MD [facility] 06/21/2021, 1:48 PM  CC: No ref. provider found

## 2021-06-21 NOTE — Patient Instructions (Signed)
Continue using BiPAP on a regular basis  I will see you back in 6 months  Call with significant concerns  Continue weight loss efforts
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
## 2023-06-12 ENCOUNTER — Encounter: Payer: Self-pay | Admitting: Allergy

## 2023-06-12 ENCOUNTER — Ambulatory Visit: Payer: Self-pay | Admitting: Allergy

## 2023-06-12 VITALS — BP 136/86 | HR 106 | Temp 98.4°F | Resp 20 | Ht 69.75 in | Wt 331.0 lb

## 2023-06-12 DIAGNOSIS — H1013 Acute atopic conjunctivitis, bilateral: Secondary | ICD-10-CM | POA: Diagnosis not present

## 2023-06-12 DIAGNOSIS — J310 Chronic rhinitis: Secondary | ICD-10-CM

## 2023-06-12 DIAGNOSIS — H109 Unspecified conjunctivitis: Secondary | ICD-10-CM

## 2023-06-12 NOTE — Patient Instructions (Addendum)
 Rhinoconjunctivitis, presumed allergic Chronic seasonal allergic rhinitis exacerbated in spring and fall. Previous antihistamines ineffective or caused adverse effects. Discussed newer antihistamines with fewer side effects. Emphasized safe neti pot use. Explained preference for skin testing over blood testing. Discussed allergy immunotherapy as long-term solution. - Trial fexofenadine 180mg  or levocetirizine 5mg  daily as needed.  These are newer long-acting antihistamines to try to replace Benadryl use - Use saline rinse kit to help flush/clean nasal cavity.  Use distilled water or boil water and bring to room temperature (do not use tap water).  Keep mouth open during entirety of rinse.  Kit provided.  - Recommend Pataday eye drops 1 drop each eye daily as needed for itchy/watery eyes.  - Discuss skin testing for allergen identification.  Schedule skin testing visit and avoid antihistamines for 3 days prior.   - Discuss allergy immunotherapy if skin testing positive. - Provide avoidance measure handouts post-allergy testing.  Schedule skin testing visit.  Routine follow-up in 4-6 months.

## 2023-06-12 NOTE — Progress Notes (Signed)
 New Patient Note  RE: Brian Wu MRN: [id number] DOB: June 18, 1980 Date of Office Visit: 06/12/2023  Primary care provider: Patient, No Pcp Per  Chief Complaint: allergies  History of present illness: Brian Wu is a 43 y.o. male presenting today for evaluation of environmental allergies.  Discussed the use of AI scribe software for clinical note transcription with the patient, who gave verbal consent to proceed.  He experiences severe allergies during seasonal changes, particularly in spring and fall, since childhood. Symptoms include cough, rhinorrhea, sinus pressure in the forehead, nasal congestion, and phlegm. Last year, symptoms escalated to a sinus infection, resulting in temporary voice loss, although he did not seek medical treatment at that time.  He experiences itchy eyes as part of his allergy symptoms but has not used allergy-specific eye drops.  He uses Benadryl at the onset of symptoms but finds it only necessary at the beginning of the allergy season. He has tried other antihistamines such as Zyrtec and Claritin but discontinued them due to side effects like excessive nasal discharge. These medications have not been effective in alleviating his symptoms. He has not used nasal sprays regularly and dislikes them. He purchased a neti pot but has not used it due to concerns about potential infections.   No history of asthma, eczema, or food allergies.      Review of systems: 10pt ROS negative unless noted above in HPI  Past medical history: Past Medical History:  Diagnosis Date   Sleep apnea     Past surgical history: Past Surgical History:  Procedure Laterality Date   BRAIN SURGERY     laceration at age 36    Family history:  Family History  Problem Relation Age of Onset   Diabetes Mother    Diabetes Maternal Grandmother    Lymphoma Maternal Grandmother    Diabetes Maternal Grandfather    Diabetes Paternal Grandmother    Diabetes Paternal Grandfather     Allergic rhinitis Neg Hx    Asthma Neg Hx     Social history: Lives in a mobile home with carpeting in the bedroom with electric heating and central cooling.  No pets in the home.  There are cats and dogs outside the home.  There is no concern for water damage, mildew in the home.  There is concern for roaches in the home.  He works in [employer] and does [employer].  Denies a smoking history.   Medication List: Current Outpatient Medications  Medication Sig Dispense Refill   diphenhydrAMINE (BENADRYL) 25 mg capsule Take 25 mg by mouth every 6 (six) hours as needed.     No current facility-administered medications for this visit.    Known medication allergies: No Known Allergies   Physical examination: Blood pressure 136/86, pulse (!) 106, temperature 98.4 F (36.9 C), temperature source Temporal, resp. rate 20, height 5' 9.75" (1.772 m), weight (!) 331 lb (150.1 kg), SpO2 98%.  General: Alert, interactive, in no acute distress. HEENT: PERRLA, TMs pearly gray, turbinates minimally edematous without discharge, post-pharynx non erythematous. Neck: Supple without lymphadenopathy. Lungs: Clear to auscultation without wheezing, rhonchi or rales. {no increased work of breathing. CV: Normal S1, S2 without murmurs. Abdomen: Nondistended, nontender. Skin: Warm and dry, without lesions or rashes. Extremities:  No clubbing, cyanosis or edema. Neuro:   Grossly intact.  Diagnostics/Labs: None today  Assessment and plan: Rhinoconjunctivitis, presumed allergic Chronic seasonal allergic rhinitis exacerbated in spring and fall. Previous antihistamines ineffective or caused adverse effects. Discussed newer antihistamines  with fewer side effects. Emphasized safe neti pot use. Explained preference for skin testing over blood testing. Discussed allergy immunotherapy as long-term solution. - Trial fexofenadine 180mg  or levocetirizine 5mg  daily as needed.  These are newer long-acting  antihistamines to try to replace Benadryl use - Use saline rinse kit to help flush/clean nasal cavity.  Use distilled water or boil water and bring to room temperature (do not use tap water).  Keep mouth open during entirety of rinse.  Kit provided.  - Recommend Pataday eye drops 1 drop each eye daily as needed for itchy/watery eyes.  - Discuss skin testing for allergen identification.  Schedule skin testing visit and avoid antihistamines for 3 days prior.   - Discuss allergy immunotherapy if skin testing positive. - Provide avoidance measure handouts post-allergy testing.  Schedule skin testing visit. (Environment 1-55) Routine follow-up in 4-6 months.  I appreciate the opportunity to take part in Brian Wu's care. Please do not hesitate to contact me with questions.  Sincerely,   Catha Clink, MD Allergy/Immunology [facility]

## 2023-07-03 ENCOUNTER — Encounter: Payer: Self-pay | Admitting: Allergy

## 2023-07-03 ENCOUNTER — Ambulatory Visit: Admitting: Allergy

## 2023-07-03 DIAGNOSIS — H1013 Acute atopic conjunctivitis, bilateral: Secondary | ICD-10-CM | POA: Diagnosis not present

## 2023-07-03 DIAGNOSIS — J302 Other seasonal allergic rhinitis: Secondary | ICD-10-CM

## 2023-07-03 DIAGNOSIS — J3089 Other allergic rhinitis: Secondary | ICD-10-CM | POA: Diagnosis not present

## 2023-07-03 NOTE — Patient Instructions (Signed)
 Rhinoconjunctivitis, allergic Chronic seasonal allergic rhinitis exacerbated in spring and fall. Previous antihistamines ineffective or caused adverse effects. Discussed newer antihistamines with fewer side effects. Emphasized safe neti pot use. Discussed allergy immunotherapy as long-term solution. - Trial fexofenadine 180mg  or levocetirizine 5mg  daily as needed.  These are newer long-acting antihistamines to try to replace Benadryl use - Use saline rinse kit to help flush/clean nasal cavity.  Use distilled water or boil water and bring to room temperature (do not use tap water).  Keep mouth open during entirety of rinse.  Kit provided.  - Recommend Pataday eye drops 1 drop each eye daily as needed for itchy/watery eyes.   - Testing today showed: weeds, trees, indoor molds, outdoor molds, and dust mites - Copy of test results provided.  - Avoidance measures provided. - Consider allergy shots as a means of long-term control if medication management is not effective enough. Allergy shots re-train and reset the immune system to ignore environmental allergens and decrease the resulting immune response to those allergens (sneezing, itchy watery eyes, runny nose, nasal congestion, etc).  Allergy shots improve symptoms in 75-85% of patients. We can discuss more at a future appointment if the medications are not working for you.    Routine follow-up in 4-6 months.

## 2023-07-03 NOTE — Progress Notes (Signed)
 Follow-up Note  RE: Brian Wu MRN: [id number] DOB: March 07, 1980 Date of Office Visit: 07/03/2023   History of present illness: Brian Wu is a 43 y.o. male presenting today for skin testing visit.  He was last seen in the office on 06/12/2023 for rhinoconjunctivitis.  He is in his usual state of health today without recent illness.  He has held antihistamines for at least 3 days for testing today.   Medication List: Current Outpatient Medications  Medication Sig Dispense Refill   diphenhydrAMINE (BENADRYL) 25 mg capsule Take 25 mg by mouth every 6 (six) hours as needed.     No current facility-administered medications for this visit.     Known medication allergies: No Known Allergies  Diagnostics/Labs:  Allergy testing:   Airborne Adult Perc - 07/03/23 0926     Time Antigen Placed 9073    Allergen Manufacturer Jestine    Location Back    Number of Test 55    1. Control-Buffer 50% Glycerol Negative    2. Control-Histamine 2+    3. Bahia Negative    4. French Southern Territories Negative    5. Johnson Negative    6. [place]  Blue Negative    7. Meadow Fescue Negative    8. Perennial Rye Negative    9. Timothy Negative    10. Ragweed Mix Negative    11. Cocklebur 2+    12. Plantain,  English Negative    13. Baccharis 2+    14. Dog Fennel Negative    15. Guernsey Thistle 2+    16. Lamb's Quarters Negative    17. Sheep Sorrell Negative    18. Rough Pigweed Negative    19. Marsh Elder, Rough 2+    20. Mugwort, Common 2+    21. Box, Elder 2+    22. Cedar, red Negative    23. Sweet Gum Negative    24. Pecan Pollen 2+    25. Pine Mix Negative    26. Walnut, Black Pollen Negative    27. Red Mulberry Negative    28. Ash Mix Negative    29. Birch Mix 2+    30. Beech American 2+    31. Cottonwood, Guinea-[place]issau Negative    32. Hickory, White Negative    33. Maple Mix Negative    34. Oak, [place] Mix Negative    35. Sycamore Eastern Negative    36. Alternaria Alternata Negative    37.  Cladosporium Herbarum 2+    38. Aspergillus Mix 2+    39. Penicillium Mix Negative    40. Bipolaris Sorokiniana (Helminthosporium) 2+    41. Drechslera Spicifera (Curvularia) Negative    42. Mucor Plumbeus Negative    43. Fusarium Moniliforme 2+    44. Aureobasidium Pullulans (pullulara) Negative    45. Rhizopus Oryzae Negative    46. Botrytis Cinera 2+    47. Epicoccum Nigrum Negative    48. Phoma Betae Negative    49. Dust Mite Mix Negative    50. Cat Hair 10,000 BAU/ml Negative    51.  Dog Epithelia Negative    52. Mixed Feathers Negative    53. Horse Epithelia Negative    54. Cockroach, German Negative    55. Tobacco Leaf Negative          Intradermal - 07/03/23 0959     Time Antigen Placed 9040    Allergen Manufacturer Jestine    Location Arm    Number of Test 9    Control Negative  Bahia Negative    French Southern Territories Negative    7 Grass Negative    Ragweed Mix Negative    Mite Mix 2+    Cat Negative    Dog Negative    Cockroach Negative          Allergy testing results were read and interpreted by provider, documented by clinical staff.   Assessment and plan: Rhinoconjunctivitis, allergic Chronic seasonal allergic rhinitis exacerbated in spring and fall. Previous antihistamines ineffective or caused adverse effects. Discussed newer antihistamines with fewer side effects. Emphasized safe neti pot use. Discussed allergy immunotherapy as long-term solution. - Trial fexofenadine 180mg  or levocetirizine 5mg  daily as needed.  These are newer long-acting antihistamines to try to replace Benadryl use - Use saline rinse kit to help flush/clean nasal cavity.  Use distilled water or boil water and bring to room temperature (do not use tap water).  Keep mouth open during entirety of rinse.  Kit provided.  - Recommend Pataday eye drops 1 drop each eye daily as needed for itchy/watery eyes.   - Testing today showed: weeds, trees, indoor molds, outdoor molds, and dust mites - Copy  of test results provided.  - Avoidance measures provided. - Consider allergy shots as a means of long-term control if medication management is not effective enough. Allergy shots re-train and reset the immune system to ignore environmental allergens and decrease the resulting immune response to those allergens (sneezing, itchy watery eyes, runny nose, nasal congestion, etc).  Allergy shots improve symptoms in 75-85% of patients. We can discuss more at a future appointment if the medications are not working for you.    Routine follow-up in 4-6 months.  I appreciate the opportunity to take part in Brian Wu's care. Please do not hesitate to contact me with questions.  Sincerely,   Danita Brain, MD Allergy/Immunology [facility]

## 2024-01-15 ENCOUNTER — Encounter: Payer: Self-pay | Admitting: Family Medicine

## 2024-01-15 ENCOUNTER — Ambulatory Visit (INDEPENDENT_AMBULATORY_CARE_PROVIDER_SITE_OTHER): Payer: Self-pay | Admitting: Family Medicine

## 2024-01-15 ENCOUNTER — Telehealth: Payer: Self-pay | Admitting: *Deleted

## 2024-01-15 ENCOUNTER — Ambulatory Visit: Admitting: Allergy

## 2024-01-15 VITALS — BP 150/102 | HR 96 | Resp 16

## 2024-01-15 DIAGNOSIS — R030 Elevated blood-pressure reading, without diagnosis of hypertension: Secondary | ICD-10-CM | POA: Insufficient documentation

## 2024-01-15 DIAGNOSIS — J302 Other seasonal allergic rhinitis: Secondary | ICD-10-CM | POA: Insufficient documentation

## 2024-01-15 DIAGNOSIS — J3089 Other allergic rhinitis: Secondary | ICD-10-CM

## 2024-01-15 DIAGNOSIS — H1013 Acute atopic conjunctivitis, bilateral: Secondary | ICD-10-CM | POA: Insufficient documentation

## 2024-01-15 NOTE — Telephone Encounter (Signed)
 Brian Wu- pt is wanting to start allergy  injections. He is self pay. He would need 2 vials. So it would be 66% discount of $2400 (Vial set), which would be $816 and 66% discount for the 2 injections of $40 would be $26.40. How much would his next red maintenance 2 vials be?

## 2024-01-15 NOTE — Progress Notes (Signed)
 "  [address] [place] [address] Dept: [phone]  FOLLOW UP NOTE  Patient ID: Brian Wu, male    DOB: [birth year]  Age: 44 y.o. MRN: [id number] Date of Office Visit: 01/15/2024  Assessment  Chief Complaint: Allergies  HPI Brian Wu is a 44 year old male who presents to the clinic for follow-up visit.  He was originally seen in this clinic on 06/12/2023 by Dr. Jeneal as a new patient for evaluation of allergic rhinitis and allergic conjunctivitis.  He returned on 07/03/2023 for environmental allergy  skin testing.  Discussed the use of AI scribe software for clinical note transcription with the patient, who gave verbal consent to proceed.  History of Present Illness Brian Wu is a 44 year old male with lifelong allergies who presents for follow-up and consideration of allergy  immunotherapy.  He has experienced severe allergy  symptoms throughout his life, with the worst episode occurring over the past year. During this episode, he experienced symptoms akin to severe flu, including complete loss of voice for two to three days. His symptoms typically worsen during seasonal changes, particularly at the beginning of summer and winter.  His allergy  symptoms include runny nose, sneezing, sore throat, congestion, and red or itchy eyes. He is not currently taking any allergy  medications but has previously tried Zyrtec with variable results. He has not used nasal sprays and does not use any treatment for his eye symptoms. His last environmental allergy  skin testing on 07/03/2023 was positive to weed pollen, tree pollen, indoor mold, outdoor mold, and dust mite.  He is interested in beginning allergen immunotherapy at this time.  We discussed benefits and risk of allergen immunotherapy as well as scheduling and length of treatment course.  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 He has a history of high blood pressure, which improved with a low sodium diet, but he has since returned to consuming processed foods.  His current medications are listed in the chart.     Drug Allergies:  Allergies[1]  Physical Exam: BP (!) 150/102   Pulse 96   Resp 16   SpO2 98%    Physical Exam Vitals reviewed.  Constitutional:      Appearance: Normal appearance.  HENT:     Head: Normocephalic and atraumatic.     Right Ear: Tympanic membrane normal.     Left Ear: Tympanic membrane normal.     Nose:     Comments: Bilateral nares normal.  Pharynx normal.  Ears normal.  Eyes normal.    Mouth/Throat:     Pharynx: Oropharynx is clear.  Eyes:     Conjunctiva/sclera: Conjunctivae normal.  Cardiovascular:     Rate and Rhythm: Normal rate and regular rhythm.     Heart sounds: Normal heart sounds. No murmur heard. Pulmonary:     Effort: Pulmonary effort is normal.     Breath sounds: Normal breath sounds.     Comments: Lungs clear to auscultation Musculoskeletal:        General: Normal range of motion.     Cervical back: Normal range of motion and neck supple.  Skin:    General: Skin is warm and dry.  Neurological:     Mental Status: He is alert and oriented to person, place, and time.  Psychiatric:        Mood and Affect: Mood normal.        Behavior: Behavior normal.  Thought Content: Thought content normal.        Judgment: Judgment normal.     Assessment and Plan: 1. Seasonal and perennial allergic rhinitis   2. Allergic conjunctivitis of both eyes   3. Elevated blood pressure reading     No orders of the defined types were placed in this encounter.   Patient Instructions  Allergic rhinitis Continue allergen avoidance measures directed toward weed pollen, tree pollen, indoor mold, outdoor mold, and dust mite as listed below Continue an antihistamine once a day if needed for runny nose or itch.  You may take it additional dose of antihistamine  once a day if needed for breakthrough symptoms. Remember to rotate to a different antihistamine about every 3 months. Some examples of over the counter antihistamines include Zyrtec (cetirizine), Xyzal (levocetirizine), Allegra (fexofenadine), and Claritin (loratidine).  Consider saline nasal rinses as needed for nasal symptoms. Use this before any medicated nasal sprays for best result Consider allergen immunotherapy if your symptoms are not well-controlled with the treatment plan as listed above  Make an appointment for your first allergy  injection for about 3 weeks from now.  After your first injection come to the clinic once or twice a week for injections. Have access to a set of EpiPen 's per injection protocol  Allergic conjunctivitis Some over the counter eye drops include Pataday one drop in each eye once a day as needed for red, itchy eyes OR Zaditor one drop in each eye twice a day as needed for red itchy eyes. Avoid eye drops that say red eye relief as they may contain medications that dry out your eyes.   Your blood pressure was elevated at today's visit. Follow up with your primary care provider for evaluation and treatment of your blood pressure  Call the clinic if this treatment plan is not working well for you.  Follow up in 4 months or sooner if needed.   Return in about 4 months (around 05/14/2024), or if symptoms worsen or fail to improve.    Thank you for the opportunity to care for this patient.  Please do not hesitate to contact me with questions.  Arlean Mutter, FNP [facility]          [1] No Known Allergies  "

## 2024-01-15 NOTE — Patient Instructions (Addendum)
 Allergic rhinitis Continue allergen avoidance measures directed toward weed pollen, tree pollen, indoor mold, outdoor mold, and dust mite as listed below Continue an antihistamine once a day if needed for runny nose or itch.  You may take it additional dose of antihistamine once a day if needed for breakthrough symptoms. Remember to rotate to a different antihistamine about every 3 months. Some examples of over the counter antihistamines include Zyrtec (cetirizine), Xyzal (levocetirizine), Allegra (fexofenadine), and Claritin (loratidine).  Consider saline nasal rinses as needed for nasal symptoms. Use this before any medicated nasal sprays for best result Consider allergen immunotherapy if your symptoms are not well-controlled with the treatment plan as listed above  Make an appointment for your first allergy  injection for about 3 weeks from now.  After your first injection come to the clinic once or twice a week for injections.  Allergic conjunctivitis Some over the counter eye drops include Pataday one drop in each eye once a day as needed for red, itchy eyes OR Zaditor one drop in each eye twice a day as needed for red itchy eyes. Avoid eye drops that say red eye relief as they may contain medications that dry out your eyes.   Your blood pressure was elevated at today's visit. Follow up with your primary care provider for evaluation and treatment of your blood pressure  Call the clinic if this treatment plan is not working well for you.  Follow up in 4 months or sooner if needed.  Reducing Pollen Exposure The American Academy of Allergy , Asthma and Immunology suggests the following steps to reduce your exposure to pollen during allergy  seasons. Do not hang sheets or clothing out to dry; pollen may collect on these items. Do not mow lawns or spend time around freshly cut grass; mowing stirs up pollen. Keep windows closed at night.  Keep car windows closed while driving. Minimize morning  activities outdoors, a time when pollen counts are usually at their highest. Stay indoors as much as possible when pollen counts or humidity is high and on windy days when pollen tends to remain in the air longer. Use air conditioning when possible.  Many air conditioners have filters that trap the pollen spores. Use a HEPA room air filter to remove pollen form the indoor air you breathe.  Control of Mold Allergen Mold and fungi can grow on a variety of surfaces provided certain temperature and moisture conditions exist.  Outdoor molds grow on plants, decaying vegetation and soil.  The major outdoor mold, Alternaria and Cladosporium, are found in very high numbers during hot and dry conditions.  Generally, a late Summer - Fall peak is seen for common outdoor fungal spores.  Rain will temporarily lower outdoor mold spore count, but counts rise rapidly when the rainy period ends.  The most important indoor molds are Aspergillus and Penicillium.  Dark, humid and poorly ventilated basements are ideal sites for mold growth.  The next most common sites of mold growth are the bathroom and the kitchen.  Outdoor [employer] Use air conditioning and keep windows closed Avoid exposure to decaying vegetation. Avoid leaf raking. Avoid grain handling. Consider wearing a face mask if working in moldy areas.  Indoor Mold Control Maintain humidity below 50%. Clean washable surfaces with 5% bleach solution. Remove sources e.g. Contaminated carpets.   Control of Dust Mite Allergen Dust mites play a major role in allergic asthma and rhinitis. They occur in environments with high humidity wherever human skin is found. Dust mites  absorb humidity from the atmosphere (ie, they do not drink) and feed on organic matter (including shed human and animal skin). Dust mites are a microscopic type of insect that you cannot see with the naked eye. High levels of dust mites have been detected from mattresses, pillows, carpets,  upholstered furniture, bed covers, clothes, soft toys and any woven material. The principal allergen of the dust mite is found in its feces. A gram of dust may contain 1,000 mites and 250,000 fecal particles. Mite antigen is easily measured in the air during house cleaning activities. Dust mites do not bite and do not cause harm to humans, other than by triggering allergies/asthma.  Ways to decrease your exposure to dust mites in your home:  1. Encase mattresses, box springs and pillows with a mite-impermeable barrier or cover  2. Wash sheets, blankets and drapes weekly in hot water (130 F) with detergent and dry them in a dryer on the hot setting.  3. Have the room cleaned frequently with a vacuum cleaner and a damp dust-mop. For carpeting or rugs, vacuuming with a vacuum cleaner equipped with a high-efficiency particulate air (HEPA) filter. The dust mite allergic individual should not be in a room which is being cleaned and should wait 1 hour after cleaning before going into the room.  4. Do not sleep on upholstered furniture (eg, couches).  5. If possible removing carpeting, upholstered furniture and drapery from the home is ideal. Horizontal blinds should be eliminated in the rooms where the person spends the most time (bedroom, study, television room). Washable vinyl, roller-type shades are optimal.  6. Remove all non-washable stuffed toys from the bedroom. Wash stuffed toys weekly like sheets and blankets above.  7. Reduce indoor humidity to less than 50%. Inexpensive humidity monitors can be purchased at most hardware stores. Do not use a humidifier as can make the problem worse and are not recommended.

## 2024-01-25 NOTE — Telephone Encounter (Signed)
 Mailed pricing for self pay.
